# Patient Record
Sex: Male | Born: 1995 | Race: Black or African American | Hispanic: No | Marital: Single | Smoking: Never smoker
Health system: Southern US, Community
[De-identification: ages and names within clinical notes are randomized; demographics above are authoritative.]

## PROBLEM LIST (undated history)

## (undated) DIAGNOSIS — I10 Essential (primary) hypertension: Secondary | ICD-10-CM

## (undated) DIAGNOSIS — E119 Type 2 diabetes mellitus without complications: Secondary | ICD-10-CM

## (undated) HISTORY — PX: TONSILLECTOMY AND ADENOIDECTOMY: SUR1326

---
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
## 2015-04-03 ENCOUNTER — Ambulatory Visit: Payer: Medicaid Other

## 2015-04-03 ENCOUNTER — Ambulatory Visit
Admission: EM | Admit: 2015-04-03 | Discharge: 2015-04-03 | Disposition: A | Discharge status: Home / Self Care | Payer: Medicaid Other | Attending: Internal Medicine | Admitting: Internal Medicine

## 2015-04-03 DIAGNOSIS — M9241 Juvenile osteochondrosis of patella, right knee: Secondary | ICD-10-CM | POA: Diagnosis not present

## 2015-04-03 DIAGNOSIS — M9251 Juvenile osteochondrosis of tibia and fibula, right leg: Secondary | ICD-10-CM | POA: Insufficient documentation

## 2015-04-03 DIAGNOSIS — M25561 Pain in right knee: Secondary | ICD-10-CM | POA: Diagnosis present

## 2015-04-03 DIAGNOSIS — E119 Type 2 diabetes mellitus without complications: Secondary | ICD-10-CM | POA: Diagnosis not present

## 2015-04-03 DIAGNOSIS — Z79899 Other long term (current) drug therapy: Secondary | ICD-10-CM | POA: Insufficient documentation

## 2015-04-03 DIAGNOSIS — I10 Essential (primary) hypertension: Secondary | ICD-10-CM | POA: Diagnosis not present

## 2015-04-03 DIAGNOSIS — R52 Pain, unspecified: Secondary | ICD-10-CM

## 2015-04-03 DIAGNOSIS — M92521 Juvenile osteochondrosis of tibia tubercle, right leg: Secondary | ICD-10-CM

## 2015-04-03 HISTORY — DX: Type 2 diabetes mellitus without complications: E11.9

## 2015-04-03 HISTORY — DX: Essential (primary) hypertension: I10

## 2015-04-03 MED ORDER — NAPROXEN 500 MG PO TABS: 500.0000 mg | ORAL_TABLET | Freq: Two times a day (BID) | ORAL | Status: AC

## 2015-04-03 NOTE — ED Notes (Signed)
X 2 weeks. Pt reports no known incident to cause the pain. Pt reports pain with ambulation and flexion. States the pain is "throbbing", but sharp when he stands on the affected knee.

## 2015-04-03 NOTE — ED Provider Notes (Signed)
CSN: [id number]     Arrival date & time 04/03/15  1612 History   First MD Initiated Contact with Patient 04/03/15 1800     Chief Complaint  Patient presents with  . Knee Pain   (Consider location/radiation/quality/duration/timing/severity/associated sxs/prior Treatment) HPI   19 year old gentleman with a past history of Osgood-Schlatter's disease who presents with a two-week history of anterior knee pain he indicates the tibial tubercle. He plays basketball frequently mostly half court. he states that the pain is worsening and seems worse whenever he has his knee in full extension. Is also painful to ambulate. He tries to ambulate with a slightly flexed knee in order not to have pain. He denies any swelling. He denies night pain fever chills.  Past Medical History  Diagnosis Date  . Hypertension   . Diabetes mellitus without complication    Past Surgical History  Procedure Laterality Date  . Tonsillectomy and adenoidectomy     Family History  Problem Relation Age of Onset  . Heart attack Father    History  Substance Use Topics  . Smoking status: Never Smoker   . Smokeless tobacco: Never Used  . Alcohol Use: No    Review of Systems  Constitutional: Positive for activity change.  Musculoskeletal: Positive for myalgias and gait problem.  All other systems reviewed and are negative.   Allergies  Penicillins  Home Medications   Prior to Admission medications   Medication Sig Start Date End Date Taking? Authorizing Provider  lisinopril (PRINIVIL,ZESTRIL) 10 MG tablet Take 10 mg by mouth daily.   Yes Historical Provider, MD  metFORMIN (GLUCOPHAGE) 1000 MG tablet Take 1,000 mg by mouth 2 (two) times daily with a meal.   Yes Historical Provider, MD  naproxen (NAPROSYN) 500 MG tablet Take 1 tablet (500 mg total) by mouth 2 (two) times daily with a meal. 04/03/15   Lutricia FeilWilliam P Roemer, PA-C   BP 145/88 mmHg  Pulse 88  Temp(Src) 97.6 F (36.4 C) (Tympanic)  Ht 5\' 10"  (1.778 m)  Wt  306 lb (138.801 kg)  BMI 43.91 kg/m2  SpO2 98% Physical Exam  Constitutional: He is oriented to person, place, and time. He appears well-developed and well-nourished.  HENT:  Head: Normocephalic and atraumatic.  Eyes: EOM are normal. Pupils are equal, round, and reactive to light.  Musculoskeletal: He exhibits tenderness.  Exam is of the knees shows no effusion bilaterally. Quadriceps shows good control bilaterally but with a soft vastus medialis. Is full range of motion to flexion and extension. Medial and collateral ligaments are intact to stressing. Negative anterior and posterior drawer sign. There is a negative McMurray's sign. Maximal tenderness is sharp and localized over the anterior tibial tuberosity which is tender to pressure and makes him grimace. There is no appreciable swelling or warmth.  Neurological: He is alert and oriented to person, place, and time. He has normal reflexes.  Skin: Skin is warm and dry.  Psychiatric: He has a normal mood and affect. His behavior is normal. Judgment and thought content normal.    ED Course  Procedures (including critical care time) Labs Review Labs Reviewed - No data to display  Imaging Review Dg Knee Complete 4 Views Right  04/03/2015   CLINICAL DATA:  Right knee pain and tenderness. Patient refused to bear weight for this exam. Duration: 2 weeks.  EXAM: RIGHT KNEE - COMPLETE 4+ VIEW  COMPARISON:  None.  FINDINGS: Separate ossicle along the distal patellar tendon in the vicinity of the tibial tubercle, compatible  with prior Osgood-Schlatter disease.  No knee effusion or additional significant abnormality observed.  IMPRESSION: 1. Bony fragment along the distal patellar tendon suggesting prior Osgood-Schlatter disease. This could be currently active or inactive, correlate with tenderness over the tibial tubercle. MRI can help in further workup of the need for internal derangement, if clinically warranted.   Electronically Signed   By: Gaylyn Rong M.D.   On: 04/03/2015 18:40     MDM   1. Osgood-Schlatter's disease of right knee   2. Pain    Medications - No data to display I discussed with the patient my findings today. It seems that he is exacerbated his pre-existing Osgood-Schlatter's disease with anterior tibial tuberosity pain. I'll start him on some quadriceps strengthening exercises and have asked him to restrict basketball for short period time. I will give him a knee brace in order to prevent bending of his knee or ambulating but use this only if he is having pain. Also start him on some Naprosyn or milligrams twice a day meals. I recommended that he be seen by a primary care physician or orthopedic surgeon for ongoing pain is seemingly respond to physical therapy or injections.  Lutricia Feil, PA-C 04/03/15 1946  Lutricia Feil, PA-C 04/03/15 1955  Lutricia Feil, PA-C 04/03/15 2142

## 2015-04-03 NOTE — Discharge Instructions (Signed)
Osgood-Schlatter Disease  with Rehab  Osgood-Schlatter disease affects the growth plate of the shinbone (tibia) just below the knee joint. The condition involves pain and inflammation below the knee. The tibial tubercle is a bony bump (prominence) below the knee, where the patellar tendon attaches to the shinbone. The patellar tendon is connected to the quadriceps thigh muscles, which are responsible for straightening the knee and bending the hip. In skeletally immature individuals, the tibial tubercle contains a growth plate that is vulnerable to injury, from stress placed on it by the patellar tendon. Osgood-Schlatter disease is a temporary condition that typically goes away with skeletal maturity (at about 20 years of age).  SYMPTOMS   · A slightly swollen, warm, and tender bump below the knee, where the patellar tendon inserts.  · Pain with activity, especially straightening the leg against force (stair climbing, jumping, deep knee bends, weight-lifting) or following an extended period of vigorous exercise in an adolescent. In more severe cases, pain occurs during less vigorous activity.  CAUSES   Osgood-Schlatter disease is caused by repeated stress to the tibial tubercle growth plate. This stress causes the area to become inflamed, resulting in pain.   RISK INCREASES WITH:   · Conditioning routines that are too strenuous, such as running, jumping, or jogging.  · Being overweight.  · Boys ages 11 to 18.  · Rapid skeletal growth.  · Poor strength and flexibility.  PREVENTION  · Maintain a healthy body weight.  · Warm up and stretch properly before activity.  · Allow for adequate recovery between workouts.  · Learn and use proper exercise technique.  · Maintain physical fitness:  ¨ Strength, flexibility, and endurance.  ¨ Cardiovascular fitness.  PROGNOSIS   The outcome for Osgood-Schlatter disease depends on the severity of the condition. Mild cases may be resolved with a slight reduction of activity level.  However, moderate to severe cases may require significantly reduced activity and, sometimes, restraining the knee for 3 to 4 months.   RELATED COMPLICATIONS   · Bone infection.  · Recurrence of the condition in adulthood, resulting in (symptomatic) bone fragments below the affected knee (ossicle).  · Persisting bump, below the kneecap.  TREATMENT  Treatment first involves the use of ice and medicine, to reduce pain and inflammation. The use of strengthening and stretching exercises may help reduce pain with activity, especially exercising the quadriceps and hamstrings (thigh) muscles. These exercises may be performed at home or with a therapist. Activities that cause symptoms to get worse should be avoided, until symptoms begin to go away. Severe cases may be referred to a therapist for further evaluation and treatment. Uncommonly, the affected knee may be restrained for 6 to 8 weeks. Your caregiver may advise the use of a brace between kneecap and tibial tubercle, on top of the patellar tendon (patellar band), that may help relieve symptoms. Surgery is rarely needed in a skeletally immature individual, but it may be considered for skeletally mature individuals.   MEDICATION   · If pain medicine is needed, nonsteroidal anti-inflammatory medicines (aspirin and ibuprofen), or other minor pain relievers (acetaminophen), are often advised.  · Do not take pain medicine for 7 days before surgery.  · Prescription pain relievers may be given, if your caregiver thinks they are needed. Use only as directed and only as much as you need.  HEAT AND COLD  · Cold treatment (icing) should be applied for 10 to 15 minutes every 2 to 3 hours for inflammation and pain,   and immediately after activity that aggravates your symptoms. Use ice packs or an ice massage.  · Heat treatment may be used before performing stretching and strengthening activities prescribed by your caregiver, physical therapist, or athletic trainer. Use a heat pack  or a warm water soak.  SEEK MEDICAL CARE IF:  · Symptoms get worse or do not improve in 4 weeks, despite treatment.  · You develop a fever greater than 102° F (38.9° C).  EXERCISES  RANGE OF MOTION (ROM) AND STRETCHING EXERCISES - Osgood-Schlatter Disease (Osteochondrosis, Apophysitis of the Tibial Tubercle)  These exercises may help you when beginning to rehabilitate your injury. Your symptoms may resolve with or without further involvement from your physician, physical therapist or athletic trainer. While completing these exercises, remember:   · Restoring tissue flexibility helps normal motion to return to the joints. This allows healthier, less painful movement and activity.  · An effective stretch should be held for at least 30 seconds.  · A stretch should never be painful. You should only feel a gentle lengthening or release in the stretched tissue.  STRETCH - Quadriceps, Prone  · Lie on your stomach on a firm surface, such as a bed or padded floor.  · Bend your right / left knee and grasp your ankle. If you are unable to reach your ankle or pant leg, use a belt around your foot to lengthen your reach.  · Gently pull your heel toward your buttocks. Your knee should not slide out to the side. You should feel a stretch in the front of your thigh and knee.  · Hold this position for __________ seconds.  Repeat __________ times. Complete this stretch __________ times per day.   STRETCH - Hamstrings, Supine  · Lie on your back. Loop a belt or towel over the ball of your right / left foot.  · Straighten your right / left knee and slowly pull on the belt to raise your leg. Do not allow the right / left knee to bend Keep your opposite leg flat on the floor.  · Raise the leg until you feel a gentle stretch behind your right / left knee or thigh. Hold this position for __________ seconds.  Repeat __________ times. Complete this stretch __________ times per day.   STRETCH - Hamstrings, Doorway  · Lie on your back with  your right / left leg extended and resting on the wall, and the opposite leg flat on the ground, through the door. At first, position your bottom farther away from the wall.  · Keep your right / left knee straight. If you feel a stretch behind your knee or thigh, hold this position for __________ seconds.  · If you do not feel a stretch, scoot your bottom closer to the door, and hold __________ seconds.  Repeat __________ times. Complete this stretch __________ times per day.   STRETCH - Hamstrings, Standing  · Stand or sit and extend your right / left leg, placing your foot on a chair or foot stool.  · Keep a slight arch in your low back and your hips straight forward.  · Lead with your chest and lean forward at the waist until you feel a gentle stretch in the back of your right / left knee or thigh. (When done correctly, this exercise requires leaning only a small distance.)  · Hold this position for __________ seconds.  Repeat __________ times. Complete this stretch __________ times per day.  STRENGTHENING EXERCISES - Osgood-Schlatter Disease (Osteochondrosis,   Apophysitis of the Tibial Tubercle)  These exercises may help you when beginning to rehabilitate your injury. They may resolve your symptoms with or without further involvement from your physician, physical therapist or athletic trainer. While completing these exercises, remember:   · Muscles can gain both the endurance and the strength needed for everyday activities through controlled exercises.  · Complete these exercises as instructed by your physician, physical therapist or athletic trainer. Increase the resistance and repetitions only as guided by your caregiver.  STRENGTH - Quadriceps, Isometrics  · Lie on your back with your right / left leg extended and your opposite knee bent.  · Gradually tense the muscles in the front of your right / left thigh. You should see either your knee cap slide up toward your hip or increased dimpling just above the  knee. This motion will push the back of the knee down toward the floor, mat, or bed on which you are lying.  · Hold the muscle as tight as you can, without increasing your pain, for __________ seconds.  · Relax the muscles slowly and completely in between each repetition.  Repeat __________ times. Complete this exercise __________ times per day.   STRENGTH - Quadriceps, Short Arcs   · Lie on your back. Place a __________ inch towel roll under your right / left knee, so that the knee bends slightly.  · Raise only your lower leg by tightening the muscles in the front of your thigh. Do not allow your thigh to rise.  · Hold this position for __________ seconds.  Repeat __________ times. Complete this exercise __________ times per day.   OPTIONAL ANKLE WEIGHTS: Begin with ____________________, but DO NOT exceed ____________________. Increase in 1 pound/0.5 kilogram increments.  STRENGTH - Quadriceps, Straight Leg Raises  Quality counts! Watch for signs that the quadriceps muscle is working, to be sure you are strengthening the correct muscles and not "cheating" by substituting with healthier muscles.  · Lay on your back with your right / left leg extended and your opposite knee bent.  · Tense the muscles in the front of your right / left thigh. You should see either your knee cap slide up or increased dimpling just above the knee. Your thigh may even shake a bit.  · Tighten these muscles even more and raise your leg 4 to 6 inches off the floor. Hold for __________ seconds.  · Keeping these muscles tense, lower your leg.  · Relax the muscles slowly and completely between each repetition.  Repeat __________ times. Complete this exercise __________ times per day.  Document Released: 10/18/2005 Document Revised: 03/04/2014 Document Reviewed: 01/30/2009  [employer]® Patient Information ©2015 [employer]. This information is not intended to replace advice given to you by your health care provider. Make sure you discuss any  questions you have with your health care provider.

## 2015-10-16 IMAGING — CR DG KNEE COMPLETE 4+V*R*
5 series · 5 of 5 positions shown · non-contrast
Comparison: None.

CLINICAL DATA: Right knee pain and tenderness. Patient refused to
bear weight for this exam. Duration: 2 weeks.

EXAM:
RIGHT KNEE - COMPLETE 4+ VIEW

[knee ap (1 of 3)]
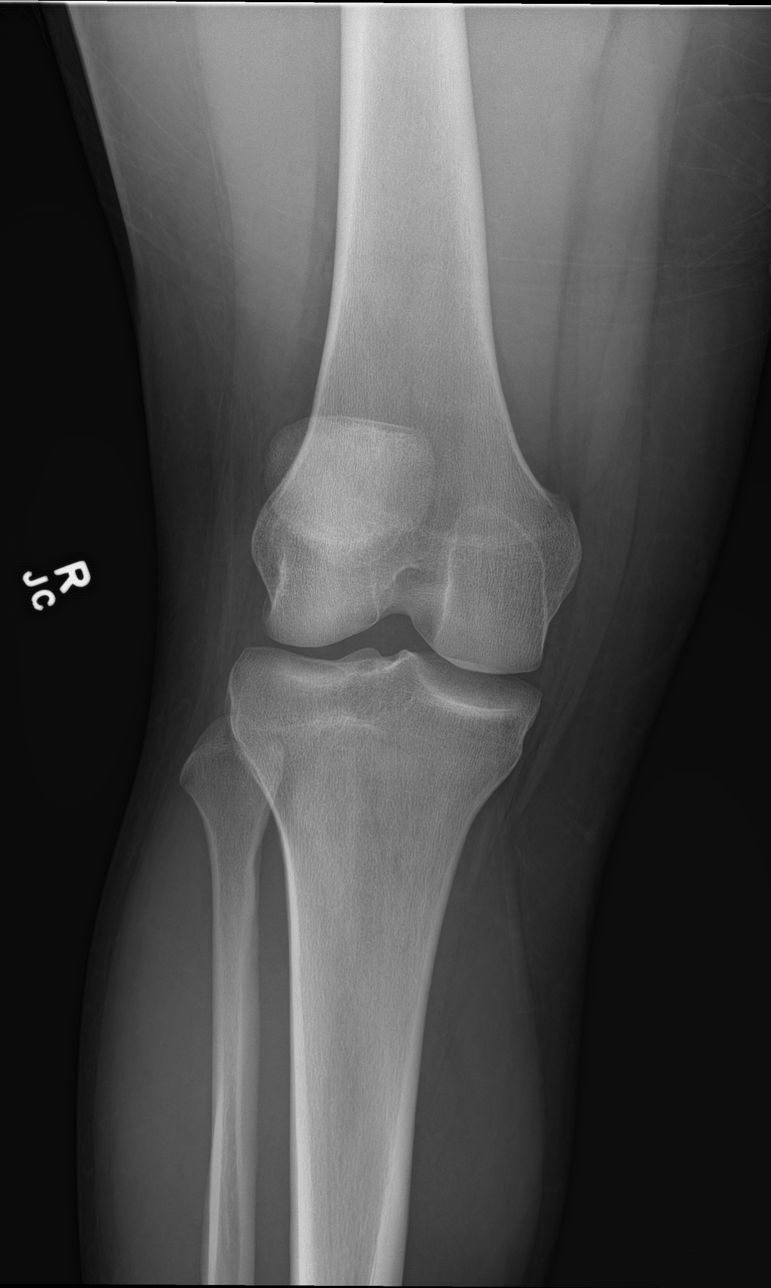

[knee lat]
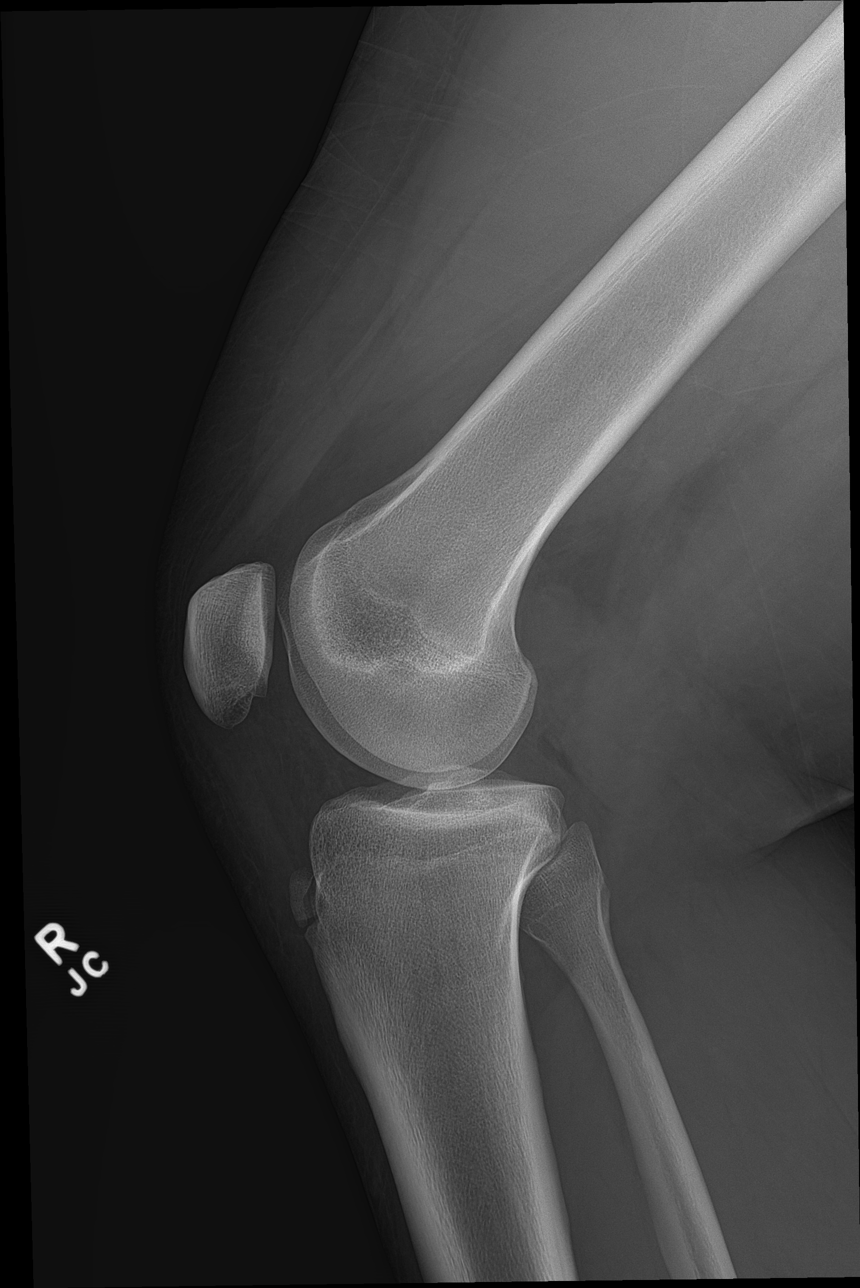

[patella skyline]
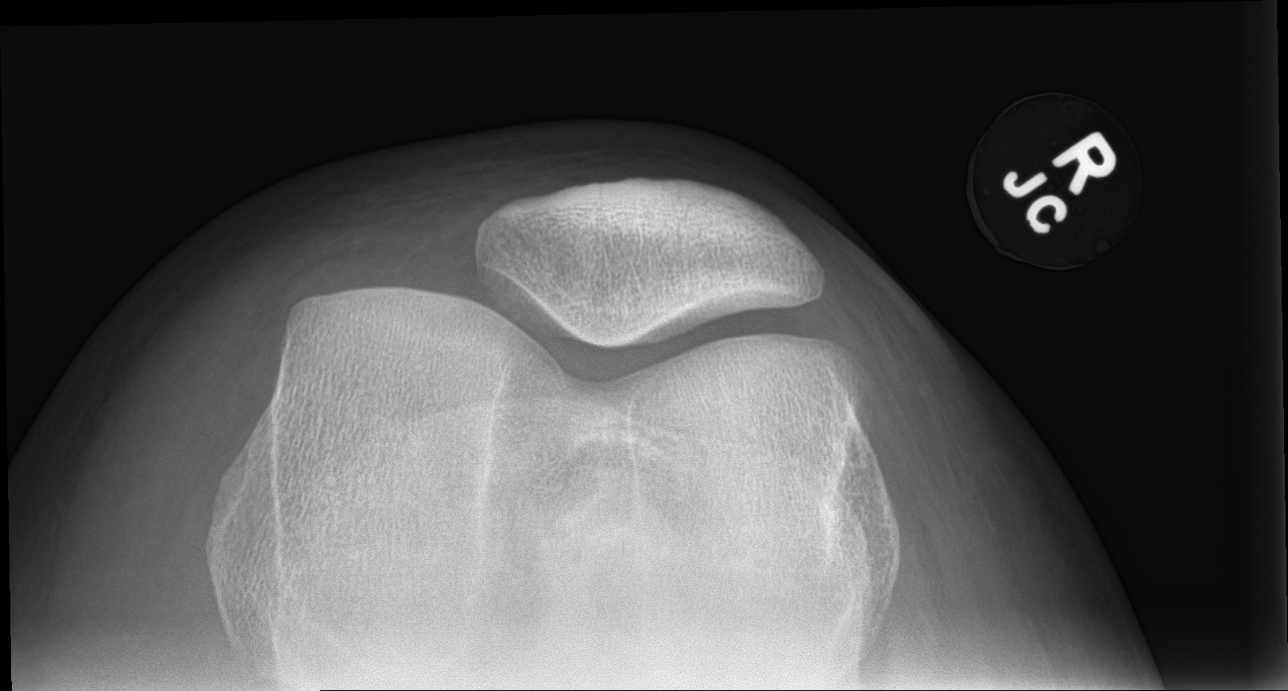

[knee ap (2 of 3)]
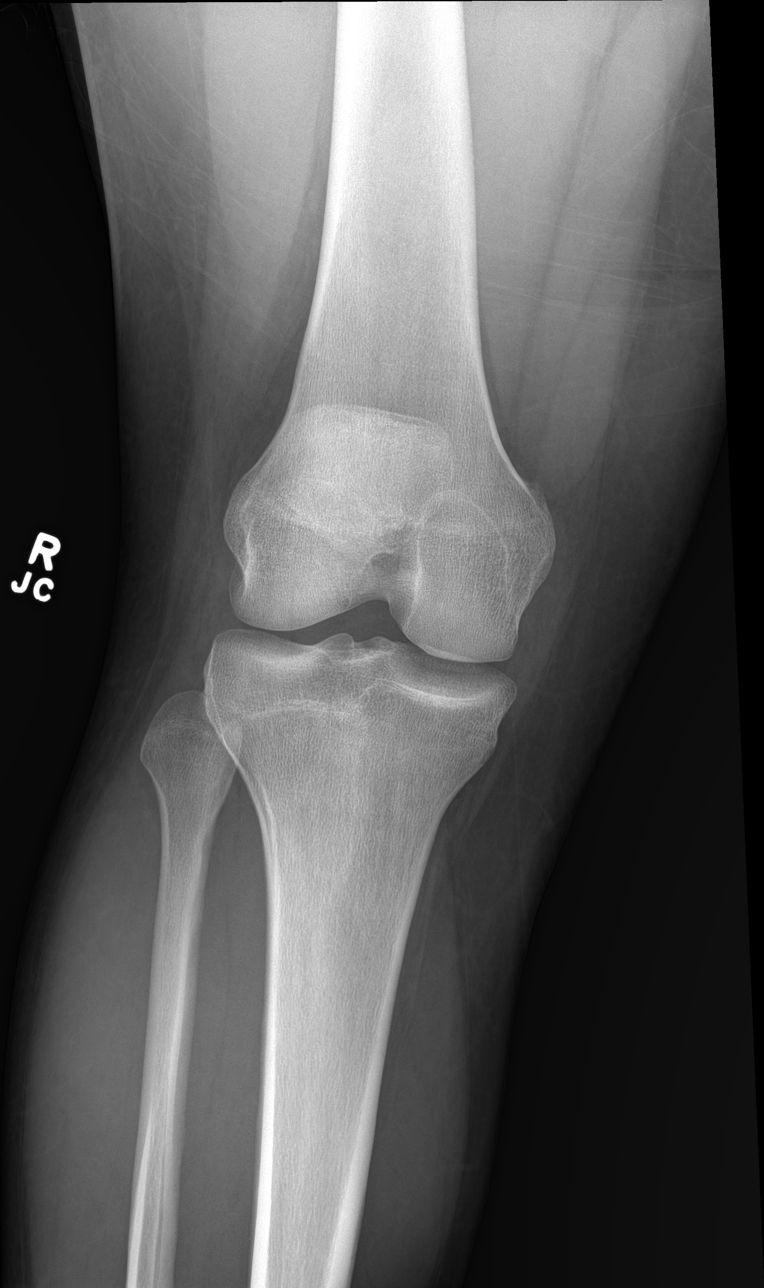

[knee ap (3 of 3)]
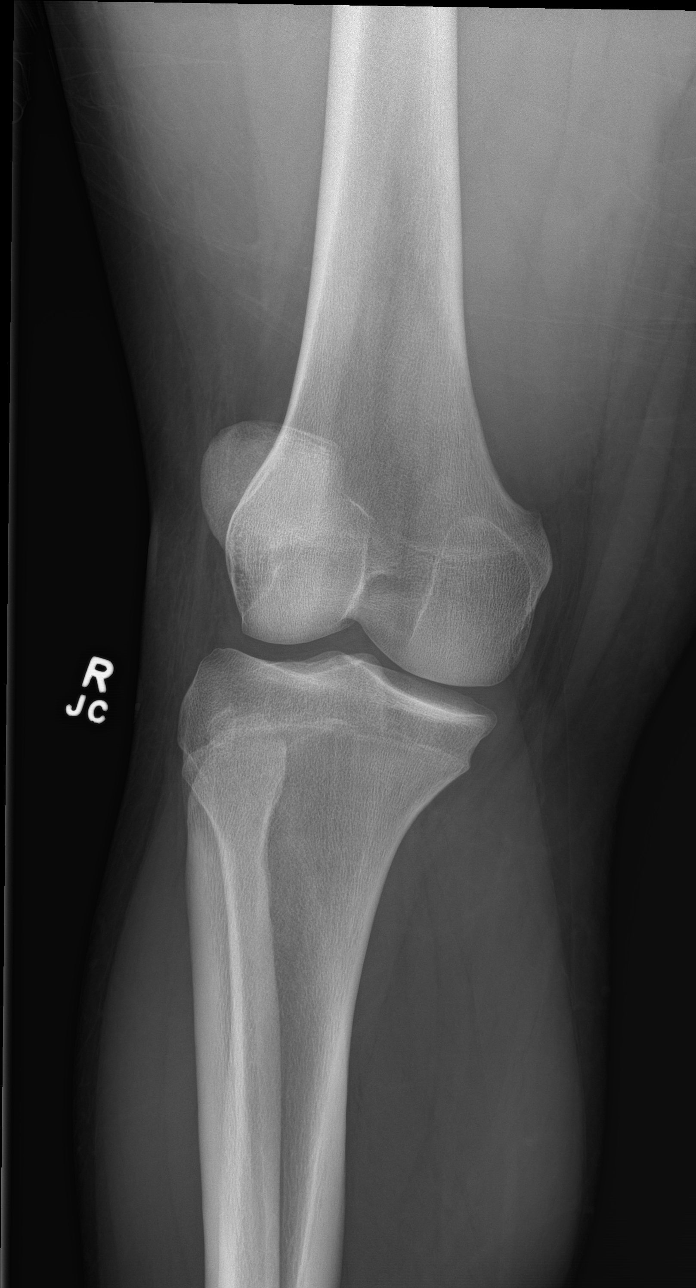

[5 of 5 positions shown; findings below may reference images not displayed]

FINDINGS: Separate ossicle along the distal patellar tendon in the vicinity of
the tibial tubercle, compatible with prior Osgood-Schlatter disease.

No knee effusion or additional significant abnormality observed.
IMPRESSION: 1. Bony fragment along the distal patellar tendon suggesting prior
Osgood-Schlatter disease. This could be currently active or
inactive, correlate with tenderness over the tibial tubercle. MRI
can help in further workup of the need for internal derangement, if
clinically warranted.
